# Patient Record
Sex: Male | Born: 1975 | Race: White | Hispanic: No | Marital: Single | Smoking: Current every day smoker
Health system: Southern US, Community
[De-identification: ages and names within clinical notes are randomized; demographics above are authoritative.]

## PROBLEM LIST (undated history)

## (undated) DIAGNOSIS — K2970 Gastritis, unspecified, without bleeding: Secondary | ICD-10-CM

## (undated) DIAGNOSIS — I10 Essential (primary) hypertension: Secondary | ICD-10-CM

## (undated) HISTORY — PX: KIDNEY TRANSPLANT: SHX239

---

## 2013-10-15 ENCOUNTER — Encounter (HOSPITAL_COMMUNITY): Payer: Self-pay | Admitting: Emergency Medicine

## 2013-10-15 ENCOUNTER — Emergency Department (HOSPITAL_COMMUNITY): Payer: Self-pay

## 2013-10-15 ENCOUNTER — Emergency Department (HOSPITAL_COMMUNITY)
Admission: EM | Admit: 2013-10-15 | Discharge: 2013-10-15 | Disposition: A | Discharge status: Home / Self Care | Payer: Self-pay | Attending: Emergency Medicine | Admitting: Emergency Medicine

## 2013-10-15 DIAGNOSIS — R1013 Epigastric pain: Secondary | ICD-10-CM

## 2013-10-15 DIAGNOSIS — Z79899 Other long term (current) drug therapy: Secondary | ICD-10-CM | POA: Insufficient documentation

## 2013-10-15 DIAGNOSIS — Z87440 Personal history of urinary (tract) infections: Secondary | ICD-10-CM | POA: Insufficient documentation

## 2013-10-15 DIAGNOSIS — I10 Essential (primary) hypertension: Secondary | ICD-10-CM | POA: Insufficient documentation

## 2013-10-15 DIAGNOSIS — Z8719 Personal history of other diseases of the digestive system: Secondary | ICD-10-CM | POA: Insufficient documentation

## 2013-10-15 DIAGNOSIS — R197 Diarrhea, unspecified: Secondary | ICD-10-CM | POA: Insufficient documentation

## 2013-10-15 DIAGNOSIS — R112 Nausea with vomiting, unspecified: Secondary | ICD-10-CM

## 2013-10-15 DIAGNOSIS — F17200 Nicotine dependence, unspecified, uncomplicated: Secondary | ICD-10-CM | POA: Insufficient documentation

## 2013-10-15 DIAGNOSIS — Z87448 Personal history of other diseases of urinary system: Secondary | ICD-10-CM | POA: Insufficient documentation

## 2013-10-15 HISTORY — DX: Essential (primary) hypertension: I10

## 2013-10-15 HISTORY — DX: Gastritis, unspecified, without bleeding: K29.70

## 2013-10-15 LAB — URINALYSIS, ROUTINE W REFLEX MICROSCOPIC
BILIRUBIN URINE: NEGATIVE
GLUCOSE, UA: NEGATIVE mg/dL
Ketones, ur: NEGATIVE mg/dL
Leukocytes, UA: NEGATIVE
Nitrite: NEGATIVE
PROTEIN: 100 mg/dL — AB
Specific Gravity, Urine: 1.012 (ref 1.005–1.030)
Urobilinogen, UA: 0.2 mg/dL (ref 0.0–1.0)
pH: 6 (ref 5.0–8.0)

## 2013-10-15 LAB — HEPATIC FUNCTION PANEL
ALT: 17 U/L (ref 0–53)
AST: 18 U/L (ref 0–37)
Albumin: 3.5 g/dL (ref 3.5–5.2)
Alkaline Phosphatase: 88 U/L (ref 39–117)
Bilirubin, Direct: <0.2 mg/dL (ref 0.0–0.3)
TOTAL PROTEIN: 7.9 g/dL (ref 6.0–8.3)
Total Bilirubin: <0.2 mg/dL — ABNORMAL LOW (ref 0.3–1.2)

## 2013-10-15 LAB — URINE MICROSCOPIC-ADD ON

## 2013-10-15 LAB — CBC WITH DIFFERENTIAL/PLATELET
BASOS ABS: 0.1 10*3/uL (ref 0.0–0.1)
Basophils Relative: 1 % (ref 0–1)
EOS ABS: 0.1 10*3/uL (ref 0.0–0.7)
EOS PCT: 1 % (ref 0–5)
HEMATOCRIT: 38.2 % — AB (ref 39.0–52.0)
Hemoglobin: 12.5 g/dL — ABNORMAL LOW (ref 13.0–17.0)
Lymphocytes Relative: 15 % (ref 12–46)
Lymphs Abs: 1.9 10*3/uL (ref 0.7–4.0)
MCH: 29.4 pg (ref 26.0–34.0)
MCHC: 32.7 g/dL (ref 30.0–36.0)
MCV: 89.9 fL (ref 78.0–100.0)
MONO ABS: 0.4 10*3/uL (ref 0.1–1.0)
Monocytes Relative: 3 % (ref 3–12)
Neutro Abs: 10.8 10*3/uL — ABNORMAL HIGH (ref 1.7–7.7)
Neutrophils Relative %: 80 % — ABNORMAL HIGH (ref 43–77)
Platelets: 368 10*3/uL (ref 150–400)
RBC: 4.25 MIL/uL (ref 4.22–5.81)
RDW: 15 % (ref 11.5–15.5)
WBC: 13.4 10*3/uL — ABNORMAL HIGH (ref 4.0–10.5)

## 2013-10-15 LAB — BASIC METABOLIC PANEL
Anion gap: 13 (ref 5–15)
BUN: 25 mg/dL — AB (ref 6–23)
CALCIUM: 9.8 mg/dL (ref 8.4–10.5)
CO2: 20 mEq/L (ref 19–32)
Chloride: 111 mEq/L (ref 96–112)
Creatinine, Ser: 1.76 mg/dL — ABNORMAL HIGH (ref 0.50–1.35)
GFR calc Af Amer: 55 mL/min — ABNORMAL LOW (ref >90)
GFR calc non Af Amer: 47 mL/min — ABNORMAL LOW (ref >90)
GLUCOSE: 103 mg/dL — AB (ref 70–99)
Potassium: 4.8 mEq/L (ref 3.7–5.3)
SODIUM: 144 meq/L (ref 137–147)

## 2013-10-15 LAB — MAGNESIUM: Magnesium: 1.9 mg/dL (ref 1.5–2.5)

## 2013-10-15 LAB — LIPASE, BLOOD: Lipase: 38 U/L (ref 11–59)

## 2013-10-15 LAB — PHOSPHORUS: Phosphorus: 2.3 mg/dL (ref 2.3–4.6)

## 2013-10-15 MED ORDER — OXYCODONE-ACETAMINOPHEN 5-325 MG PO TABS
ORAL_TABLET | ORAL | Status: DC
Start: 2013-10-15 — End: 2013-10-15

## 2013-10-15 MED ORDER — MORPHINE SULFATE 4 MG/ML IJ SOLN
4.0000 mg | Freq: Once | INTRAMUSCULAR | Status: AC
  Administered 2013-10-15: 4 mg via INTRAVENOUS
  Filled 2013-10-15: qty 1

## 2013-10-15 MED ORDER — PROMETHAZINE HCL 25 MG PO TABS: 25.0000 mg | ORAL_TABLET | Freq: Four times a day (QID) | ORAL | Status: DC | PRN

## 2013-10-15 MED ORDER — IOHEXOL 300 MG/ML  SOLN: 25.0000 mL | INTRAMUSCULAR | Status: DC

## 2013-10-15 MED ORDER — OXYCODONE-ACETAMINOPHEN 5-325 MG PO TABS: ORAL_TABLET | ORAL | Status: AC

## 2013-10-15 MED ORDER — PROMETHAZINE HCL 25 MG PO TABS: 25.0000 mg | ORAL_TABLET | Freq: Four times a day (QID) | ORAL | Status: AC | PRN

## 2013-10-15 MED ORDER — FAMOTIDINE IN NACL 20-0.9 MG/50ML-% IV SOLN
20.0000 mg | Freq: Once | INTRAVENOUS | Status: AC
  Administered 2013-10-15: 20 mg via INTRAVENOUS
  Filled 2013-10-15: qty 50

## 2013-10-15 MED ORDER — ONDANSETRON HCL 4 MG/2ML IJ SOLN
4.0000 mg | Freq: Once | INTRAMUSCULAR | Status: AC
  Administered 2013-10-15: 4 mg via INTRAVENOUS
  Filled 2013-10-15: qty 2

## 2013-10-15 NOTE — ED Provider Notes (Signed)
CSN: [id number]     Arrival date & time 10/15/13  [id number] History   First MD Initiated Contact with Patient 10/15/13 [phone]     Chief Complaint  Patient presents with  . Abdominal [id number]  . Nausea  . Emesis     (Consider location/radiation/quality/duration/timing/severity/associated sxs/prior Treatment) HPI  Shawn Rios is a [age] y.o. 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 When the pain hits it is described as tightness he states that he feels his belly hard and this, last for approximately 10 minutes. [facility] with Escherichia coli UTI susceptible to fosfomycin, Ertapenem, gentamicin and imipenem. Patient was septic and had acute kidney injury. He was not discharged on oral antibiotics. His renal function was increased to 2.86 and had a white blood cell count of 19 and  [facility], therefore, [facility].  She denies decreased urine output, urinary frequency, dysuria, dark or foul-smelling urine, blood in the urine, chest pain, shortness of breath, fever, chills. On review of systems patient notes a severe resolved peripheral edema occuring 5 days ago.  [facility]; Basline Cr 1.5 to 1.9 (as per patient)   Past Medical History  Diagnosis Date  . Gastritis   . Hypertension    Past Surgical History  Procedure Laterality Date  . Kidney transplant     No  family history on file. History  Substance Use Topics  . Smoking status: Current Every Day Smoker  . Smokeless tobacco: Not on file  . Alcohol Use: No    Review of Systems  10 systems reviewed and found to be negative, except as noted in the HPI.   Allergies  Rifampin and Vancomycin  Home Medications   Prior to Admission medications   Medication Sig Start Date End Date Taking? Authorizing Provider  B Complex Vitamins (VITAMIN-B COMPLEX) TABS Take 1 tablet by mouth daily.   Yes Historical Provider, MD  carvedilol (COREG) 25 MG tablet Take 25 mg by mouth 2 (two) times daily. 09/14/13 09/14/14 Yes Historical Provider, MD  gabapentin (NEURONTIN) 800 MG tablet Take 800 mg by mouth daily.   Yes Historical Provider, MD  sodium bicarbonate 325 MG tablet Take 650 mg by mouth 3 (three) times daily. 10/04/13  Yes Historical Provider, MD  oxyCODONE-acetaminophen (PERCOCET/ROXICET) 5-325 MG per tablet 1 to 2 tabs PO q6hrs  PRN for pain 10/15/13   Shawn ReiningNicole Jaiquan Temme, PA-C  promethazine (PHENERGAN) 25 MG tablet Take 1 tablet (25 mg total) by mouth every 6 (six) hours as needed for nausea or vomiting. 10/15/13   Shawn Feehan, PA-C   BP 153/102  Pulse 72  Temp(Src) 97.8 F (36.6 C) (Oral)  Resp 18  Ht 5\' 5"  (1.651 m)  Wt 192 lb (87.091 kg)  BMI 31.95 kg/m2  SpO2 98% Physical Exam  Nursing note and vitals reviewed. Constitutional: He is oriented to person, place, and time. He appears well-developed and well-nourished. No  distress.  HENT:  Head: Normocephalic.  Mouth/Throat: Oropharynx is clear and moist.  Eyes: Conjunctivae and EOM are normal. Pupils are equal, round, and reactive to light.  Neck: Normal range of motion.  Cardiovascular: Normal rate, regular rhythm and intact distal pulses.   Pulmonary/Chest: Effort normal and breath sounds normal. No stridor.  Abdominal: Soft. He exhibits no distension and no mass. There is tenderness. There is no rebound and no guarding.  Hyperactive bowel  sounds, tender to palpation along the epigastrium, no guarding or rebound  Musculoskeletal: Normal range of motion.  Neurological: He is alert and oriented to person, place, and time.  Psychiatric: He has a normal mood and affect.    ED Course  Procedures (including critical care time) Labs Review Labs Reviewed  URINALYSIS, ROUTINE W REFLEX MICROSCOPIC - Abnormal; Notable for the following:    Hgb urine dipstick SMALL (*)    Protein, ur 100 (*)    All other components within normal limits  BASIC METABOLIC PANEL - Abnormal; Notable for the following:    Glucose, Bld 103 (*)    BUN 25 (*)    Creatinine, Ser 1.76 (*)    GFR calc non Af Amer 47 (*)    GFR calc Af Amer 55 (*)    All other components within normal limits  HEPATIC FUNCTION PANEL - Abnormal; Notable for the following:    Total Bilirubin <0.2 (*)    All other components within normal limits  CBC WITH DIFFERENTIAL - Abnormal; Notable for the following:    WBC 13.4 (*)    Hemoglobin 12.5 (*)    HCT 38.2 (*)    Neutrophils Relative % 80 (*)    Neutro Abs 10.8 (*)    All other components within normal limits  LIPASE, BLOOD  MAGNESIUM  PHOSPHORUS  URINE MICROSCOPIC-ADD ON    Imaging Review Ct Abdomen Pelvis Wo Contrast  10/15/2013   CLINICAL DATA:  Abdominal pain.  History of renal transplant.  EXAM: CT ABDOMEN AND PELVIS WITHOUT CONTRAST  TECHNIQUE: Multidetector CT imaging of the abdomen and pelvis was performed following the standard protocol without IV contrast.  COMPARISON:  09/29/2013  FINDINGS: Heart is upper limits normal in size. Lung bases are clear. No effusions.  Liver, gallbladder, spleen, pancreas, adrenals are unremarkable. Prior left nephrectomy. Right native kidney is not visualized.  Right lower quadrant transplant kidney is again identified, unchanged. There is a hernia through the ventral wall adjacent to the right lower quadrant renal transplant containing small bowel loops, unchanged. No evidence of  bowel obstruction.  Fat proliferation within the wall of the bladder is again noted, unchanged. No free fluid, free air or adenopathy. Aorta is normal caliber.  No acute bony abnormality or focal bone lesion.  IMPRESSION: No acute findings in the abdomen or pelvis.  Prior left nephrectomy. Right native kidney not visualized. Right lower quadrant renal transplant kidney has a stable appearance.  Small right ventral wall hernia adjacent to the renal transplant containing small bowel. No evidence of bowel obstruction.   Electronically Signed   By: Charlett Nose M.D.   On: 10/15/2013 15:15     EKG Interpretation None      MDM   Final diagnoses:  Epigastric abdominal pain  Nausea vomiting and diarrhea    Filed Vitals:   10/15/13 1330 10/15/13 1400 10/15/13 1534 10/15/13 1616  BP: 159/109 144/99 155/104 153/102  Pulse: 69 76  72  Temp:    97.8 F (36.6 C)  TempSrc:  Oral  Resp: 18 17 19 18   Height:      Weight:      SpO2: 100% 99% 99% 98%    Medications  ondansetron (ZOFRAN) injection 4 mg (4 mg Intravenous Given 10/15/13 1036)  morphine 4 MG/ML injection 4 mg (4 mg Intravenous Given 10/15/13 1039)  morphine 4 MG/ML injection 4 mg (4 mg Intravenous Given 10/15/13 1244)  ondansetron (ZOFRAN) injection 4 mg (4 mg Intravenous Given 10/15/13 1428)  morphine 4 MG/ML injection 4 mg (4 mg Intravenous Given 10/15/13 1546)  famotidine (PEPCID) IVPB 20 mg (0 mg Intravenous Stopped 10/15/13 1616)    Shawn Rios is a 38 y.o. male presenting with epigastric abdominal pain nausea and vomiting. Abdominal exam is nonsurgical. Patient rates the pain as extremely severe, it woke him from sleep last night states the pain came before the vomiting and states that the vomiting may be secondary to the extreme level of the pain. Patient is renal transplant patient. States that his creatinine has been rising, was admitted for acute kidney injury secondary to Escherichia coli urinary tract infection and [facility]. Today his creatinine is at his baseline. Urinalysis shows no signs of infection. There is a white count of 13.4. According to the patient there was a white count of 13 yesterday.  11:30 AM: Patient seen and reexamined at the bedside, a repeat abdominal exam is consistent with prior with normal to hyperactive bowel sounds and mild tenderness palpation in the left upper quadrant with no guarding or rebound. Patient advised creatinine is normal for his baseline.  Serial abdominal exams remain benign. Patient is tolerating by mouth. CT with no acute abnormalities. This is likely secondary to a viral gastroenteritis. I advised the patient that there are no acute findings that warrant admission to the hospital at this time it is very important to maintain hydration and to return to the ED for any new or worsening symptoms. Patient seems reliable for followup.  Evaluation does not show pathology that would require ongoing emergent intervention or inpatient treatment. Pt is hemodynamically stable and mentating appropriately. Discussed findings and plan with patient/guardian, who agrees with care plan. All questions answered. Return precautions discussed and outpatient follow up given.   Discharge Medication List as of 10/15/2013  3:28 PM    START taking these medications   Details  oxyCODONE-acetaminophen (PERCOCET/ROXICET) 5-325 MG per tablet 1 to 2 tabs PO q6hrs  PRN for pain, Print    promethazine (PHENERGAN) 25 MG tablet Take 1 tablet (25 mg total) by mouth every 6 (six) hours as needed for nausea or vomiting., Starting 10/15/2013, Until Discontinued, [employer], PA-C 10/15/13 [phone]

## 2013-10-15 NOTE — ED Notes (Signed)
Pt from home via EMS with c/o mid abdominal pain with N/V onset 1800 last night. Last vomited last night. BP for EMS 180/111 in left arm; 158/127 in right arm.

## 2013-10-15 NOTE — ED Notes (Signed)
  Pt placed on monitor upon arrival to room from radiology. Pt monitored by 12 lead, blood pressure, and pulse ox.

## 2013-10-15 NOTE — Discharge Instructions (Signed)
Take percocet for breakthrough pain, do not drink alcohol, drive, care for children or do other critical tasks while taking percocet.  Push fluids: take small frequent sips of water or Gatorade, do not drink any soda, juice or caffeinated beverages.    Slowly resume solid diet as desired. Avoid food that are spicy, contain dairy and/or have high fat content.  Please follow with your primary care doctor in the next 2 days for a check-up. They must obtain records for further management.   Do not hesitate to return to the Emergency Department for any new, worsening or concerning symptoms.    Abdominal Pain Many things can cause abdominal pain. Usually, abdominal pain is not caused by a disease and will improve without treatment. It can often be observed and treated at home. Your health care provider will do a physical exam and possibly order blood tests and X-rays to help determine the seriousness of your pain. However, in many cases, more time must pass before a clear cause of the pain can be found. Before that point, your health care provider may not know if you need more testing or further treatment. HOME CARE INSTRUCTIONS  Monitor your abdominal pain for any changes. The following actions may help to alleviate any discomfort you are experiencing:  Only take over-the-counter or prescription medicines as directed by your health care provider.  Do not take laxatives unless directed to do so by your health care provider.  Try a clear liquid diet (broth, tea, or water) as directed by your health care provider. Slowly move to a bland diet as tolerated. SEEK MEDICAL CARE IF:  You have unexplained abdominal pain.  You have abdominal pain associated with nausea or diarrhea.  You have pain when you urinate or have a bowel movement.  You experience abdominal pain that wakes you in the night.  You have abdominal pain that is worsened or improved by eating food.  You have abdominal pain that is  worsened with eating fatty foods.  You have a fever. SEEK IMMEDIATE MEDICAL CARE IF:   Your pain does not go away within 2 hours.  You keep throwing up (vomiting).  Your pain is felt only in portions of the abdomen, such as the right side or the left lower portion of the abdomen.  You pass bloody or black tarry stools. MAKE SURE YOU:  Understand these instructions.   Will watch your condition.   Will get help right away if you are not doing well or get worse.  Document Released: 12/05/2004 Document Revised: 03/02/2013 Document Reviewed: 11/04/2012 [facility] Patient Information 2015 [employer] [place]. This information is not intended to replace advice given to you by your health care provider. Make sure you discuss any questions you have with your health care provider.

## 2013-10-16 NOTE — ED Provider Notes (Signed)
Medical screening examination/treatment/procedure(s) were performed by non-physician practitioner and as supervising physician I was immediately available for consultation/collaboration.   EKG Interpretation None        Leanthony Rhett N Quincy Boy, DO 10/16/13 0822 

## 2015-05-09 IMAGING — CT CT ABD-PELV W/O CM
2 of 4 series · 12 of 46 positions shown, 14 images · non-contrast
Comparison: 09/29/2013

CLINICAL DATA: Abdominal pain.  History of renal transplant.

EXAM:
CT ABDOMEN AND PELVIS WITHOUT CONTRAST
TECHNIQUE: Multidetector CT imaging of the abdomen and pelvis was performed
following the standard protocol without IV contrast.

[Series 201: routine, idose (2) · axial · 0.73mm/px · z∈[-420,-60]mm · 9 of 86 slices shown, 11 images]
[im 7/86  soft-tissue]
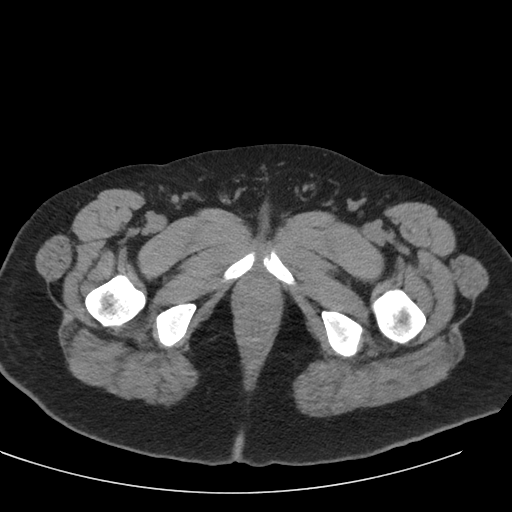
[im 7/86  bone]
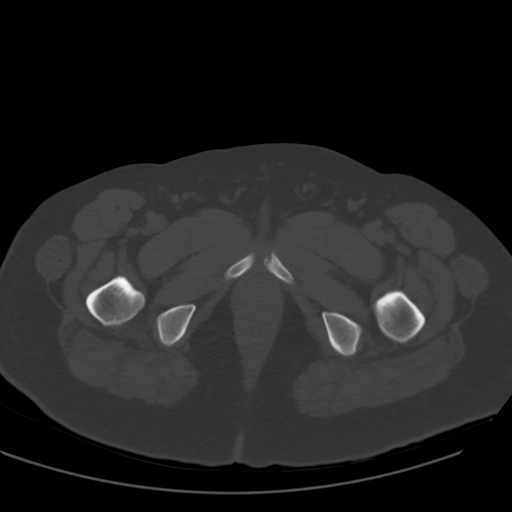
[im 18/86  soft-tissue]
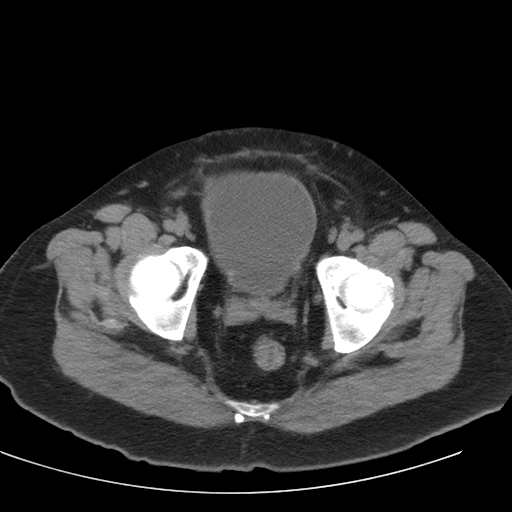
[im 24/86  soft-tissue]
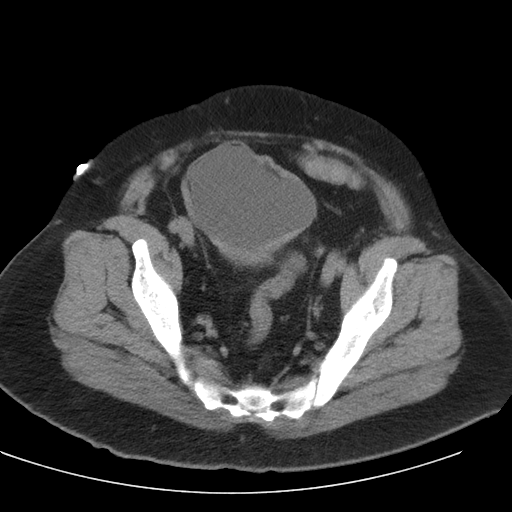
[im 35/86  soft-tissue]
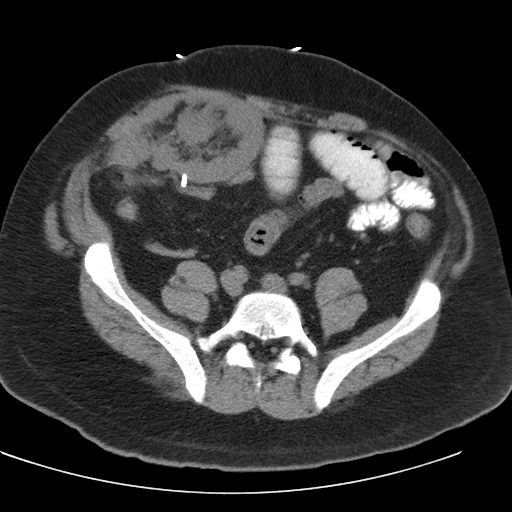
[im 45/86  soft-tissue]
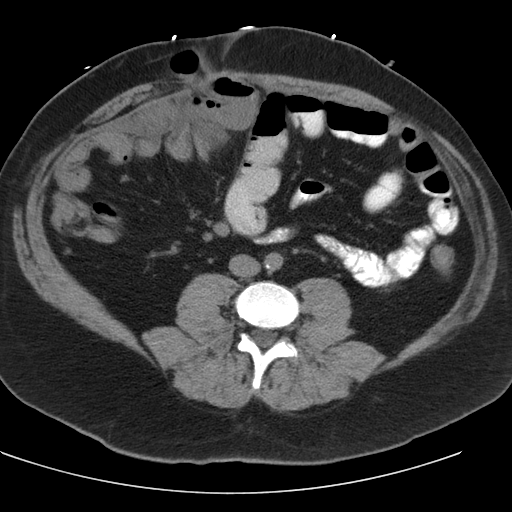
[im 52/86  soft-tissue]
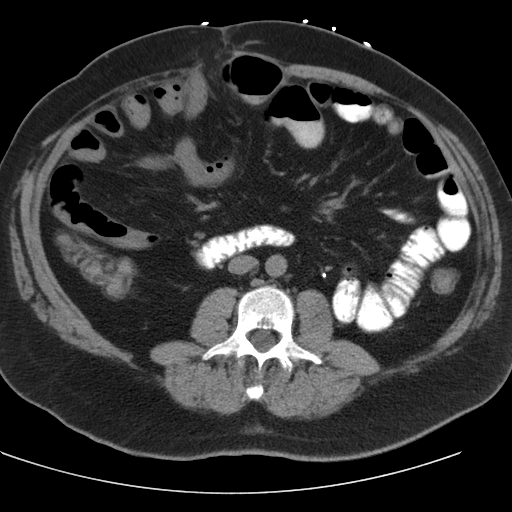
[im 62/86  soft-tissue]
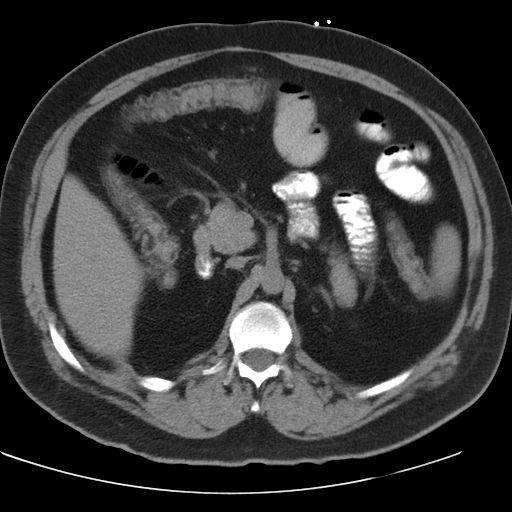
[im 72/86  soft-tissue]
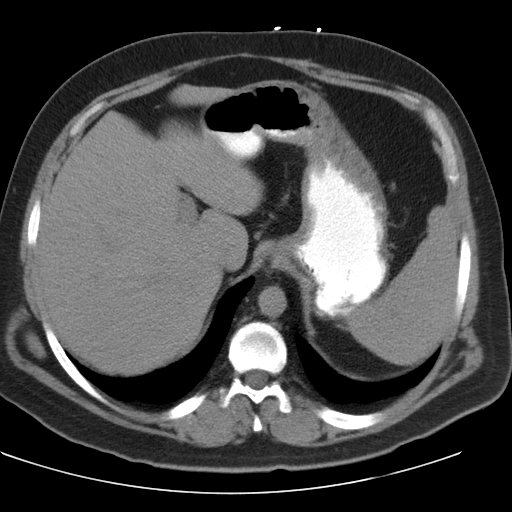
[im 79/86  soft-tissue]
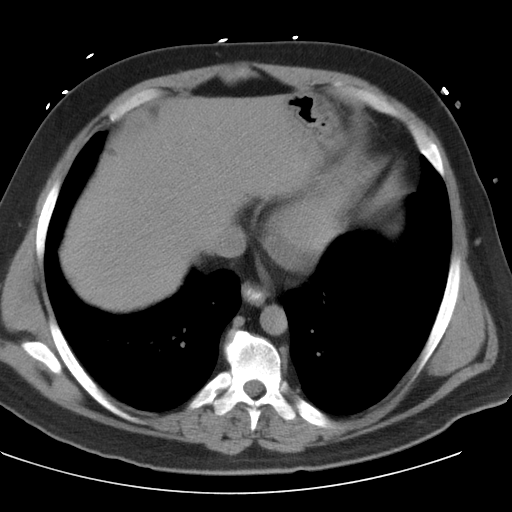
[im 79/86  bone]
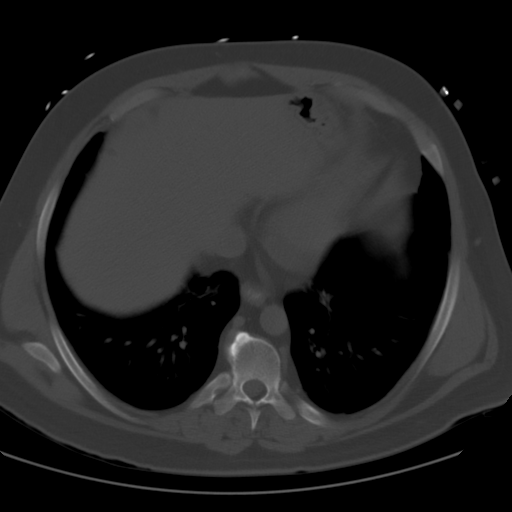

[Series 203: coronals, idose (2) · coronal · 0.45mm/px · 3 of 115 slices shown]
[im 39/115  soft-tissue]
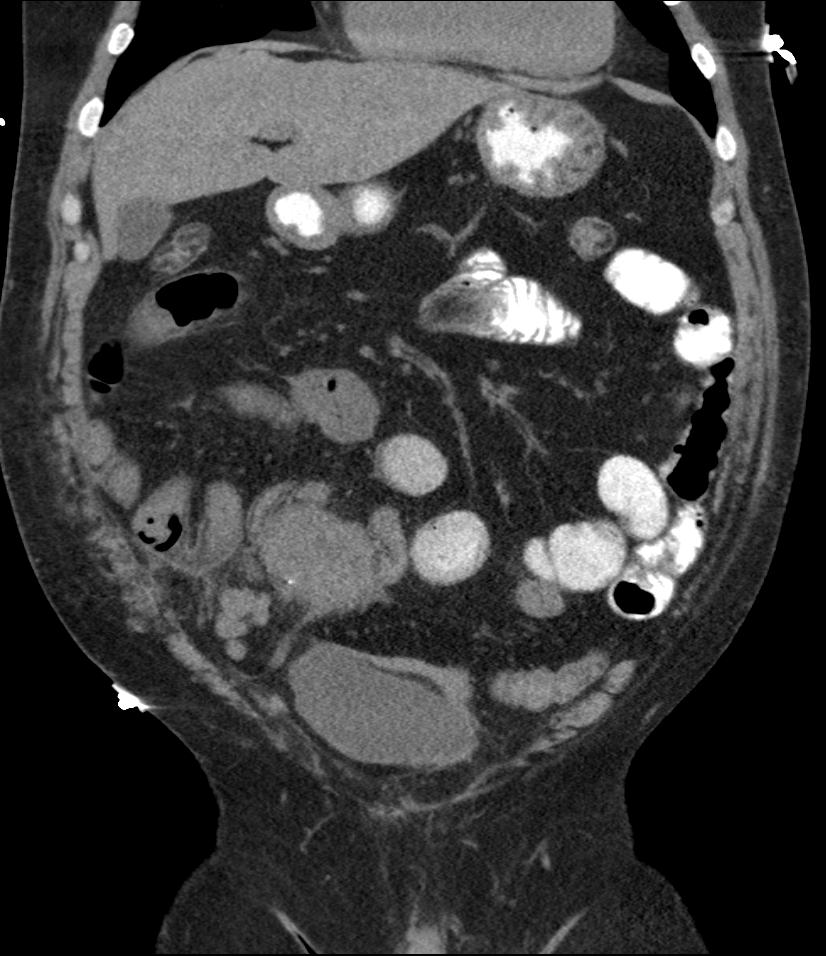
[im 51/115  soft-tissue]
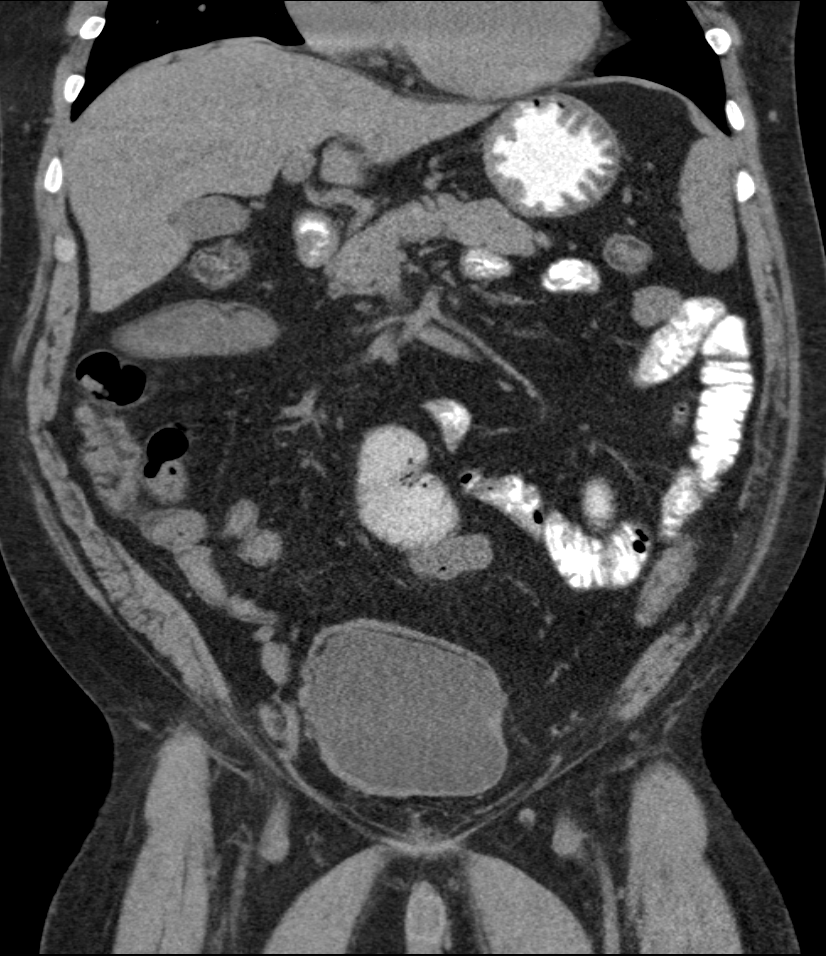
[im 64/115  soft-tissue]
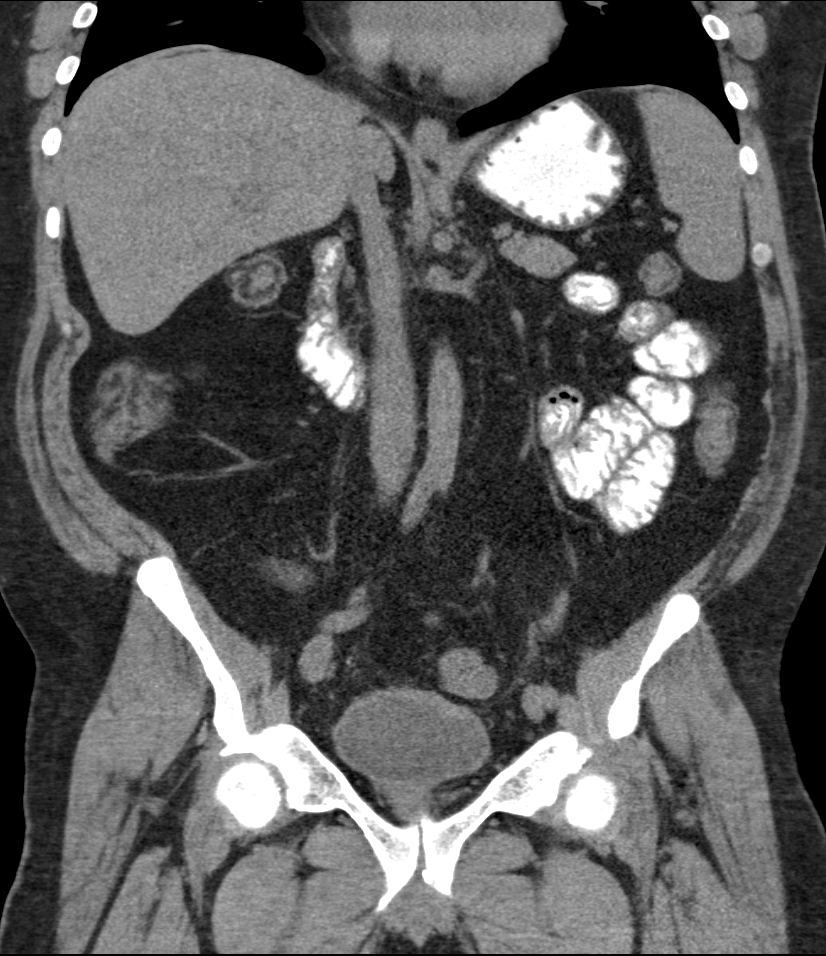

[12 of 46 positions shown; findings below may reference images not displayed]

FINDINGS: Heart is upper limits normal in size. Lung bases are clear. No
effusions.

Liver, gallbladder, spleen, pancreas, adrenals are unremarkable.
Prior left nephrectomy. Right native kidney is not visualized.

Right lower quadrant transplant kidney is again identified,
unchanged. There is a hernia through the ventral wall adjacent to
the right lower quadrant renal transplant containing small bowel
loops, unchanged. No evidence of bowel obstruction.

Fat proliferation within the wall of the bladder is again noted,
unchanged. No free fluid, free air or adenopathy. Aorta is normal
caliber.

No acute bony abnormality or focal bone lesion.
IMPRESSION: No acute findings in the abdomen or pelvis.

Prior left nephrectomy. Right native kidney not visualized. Right
lower quadrant renal transplant kidney has a stable appearance.

Small right ventral wall hernia adjacent to the renal transplant
containing small bowel. No evidence of bowel obstruction.

## 2022-03-11 DEATH — deceased
